# Patient Record
Sex: Male | Born: 1975 | Race: White | Hispanic: No | Marital: Single | Smoking: Never smoker
Health system: Southern US, Community
[De-identification: ages and names within clinical notes are randomized; demographics above are authoritative.]

## PROBLEM LIST (undated history)

## (undated) DIAGNOSIS — I10 Essential (primary) hypertension: Secondary | ICD-10-CM

## (undated) HISTORY — PX: HERNIA REPAIR: SHX51

---

## 2016-03-07 DIAGNOSIS — E782 Mixed hyperlipidemia: Secondary | ICD-10-CM | POA: Insufficient documentation

## 2017-07-25 DIAGNOSIS — K4090 Unilateral inguinal hernia, without obstruction or gangrene, not specified as recurrent: Secondary | ICD-10-CM | POA: Insufficient documentation

## 2017-09-19 ENCOUNTER — Other Ambulatory Visit: Payer: Self-pay | Admitting: Urology

## 2017-09-19 DIAGNOSIS — N201 Calculus of ureter: Secondary | ICD-10-CM

## 2017-09-19 DIAGNOSIS — N23 Unspecified renal colic: Secondary | ICD-10-CM

## 2017-09-26 ENCOUNTER — Ambulatory Visit
Admission: RE | Admit: 2017-09-26 | Discharge: 2017-09-26 | Disposition: A | Discharge status: Home / Self Care | Payer: BLUE CROSS/BLUE SHIELD | Source: Ambulatory Visit | Attending: Urology | Admitting: Urology

## 2017-09-26 DIAGNOSIS — N23 Unspecified renal colic: Secondary | ICD-10-CM

## 2017-09-26 DIAGNOSIS — K760 Fatty (change of) liver, not elsewhere classified: Secondary | ICD-10-CM | POA: Diagnosis not present

## 2017-09-26 DIAGNOSIS — N201 Calculus of ureter: Secondary | ICD-10-CM | POA: Diagnosis present

## 2017-09-26 MED ORDER — IOPAMIDOL (ISOVUE-300) INJECTION 61%
100.0000 mL | Freq: Once | INTRAVENOUS | Status: AC | PRN
  Administered 2017-09-26: 100 mL via INTRAVENOUS

## 2018-03-29 DIAGNOSIS — R7989 Other specified abnormal findings of blood chemistry: Secondary | ICD-10-CM | POA: Insufficient documentation

## 2018-04-20 IMAGING — CT CT ABD-PEL WO/W CM
2 of 4 series · 13 of 32 positions shown, 19 images · IV contrast (iopamidol)
Comparison: None.

CLINICAL DATA: Possible passage of a kidney stone 2 or 3 weeks ago.
History of hernia repair.

EXAM:
CT ABDOMEN AND PELVIS WITHOUT AND WITH CONTRAST
TECHNIQUE: Multidetector CT imaging of the abdomen and pelvis was performed
following the standard protocol before and following the bolus
administration of intravenous contrast.
CONTRAST:  100mL YINQ7Q-RNN IOPAMIDOL (YINQ7Q-RNN) INJECTION 61%

[Series 4: axial st · axial · 0.87mm/px · z∈[-1098,-648]mm · 11 of 110 slices shown, 17 images]
[im 10/110  soft-tissue]
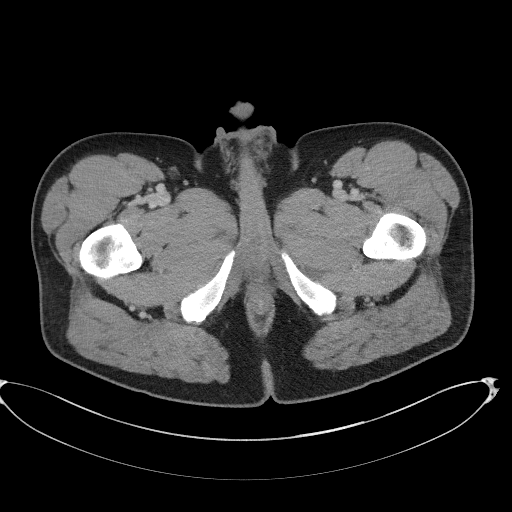
[im 10/110  bone]
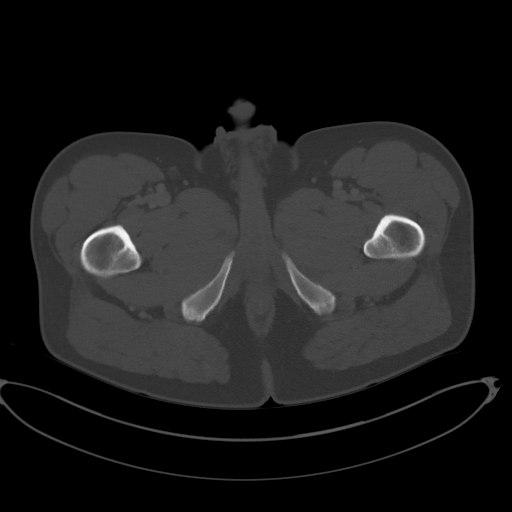
[im 19/110  soft-tissue]
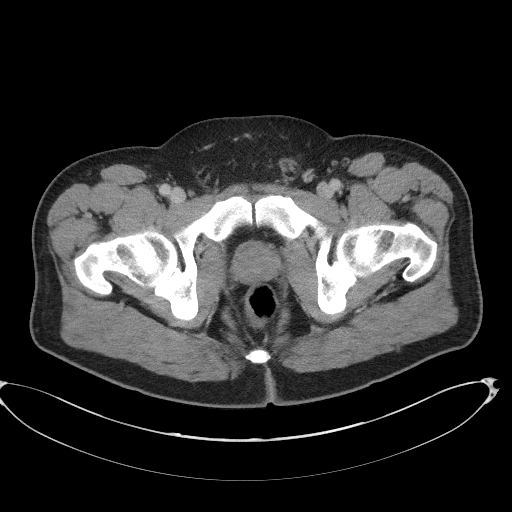
[im 28/110  soft-tissue]
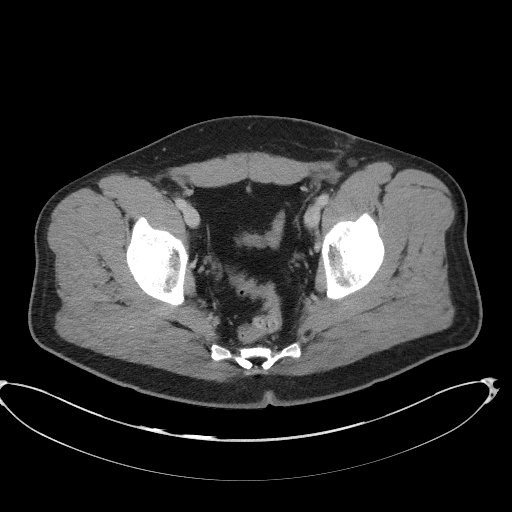
[im 37/110  soft-tissue]
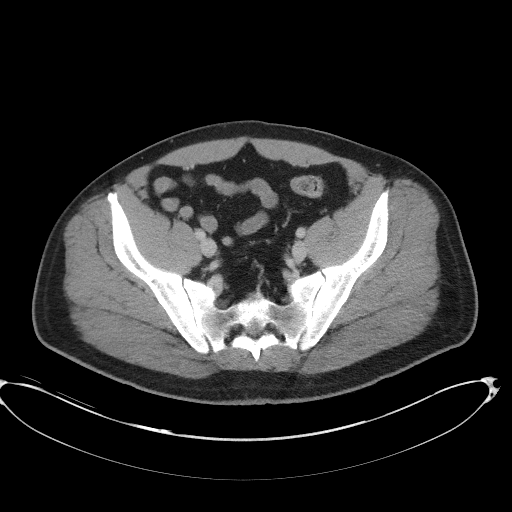
[im 46/110  soft-tissue]
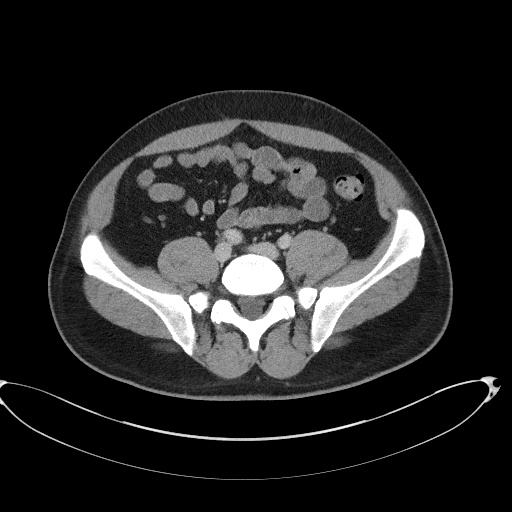
[im 55/110  soft-tissue]
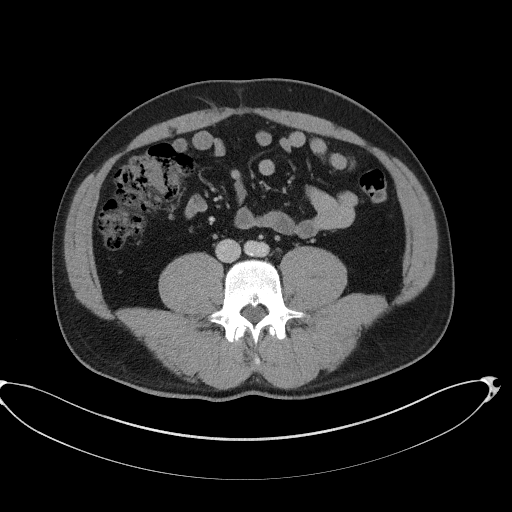
[im 64/110  soft-tissue]
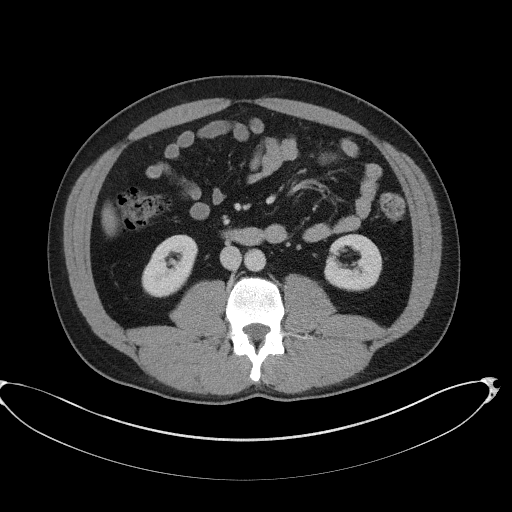
[im 73/110  soft-tissue]
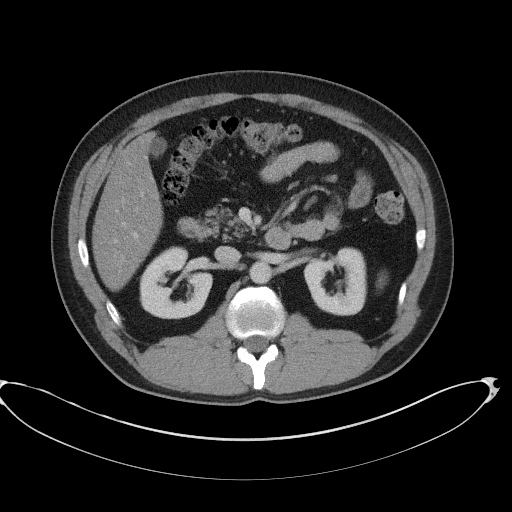
[im 73/110  lung]
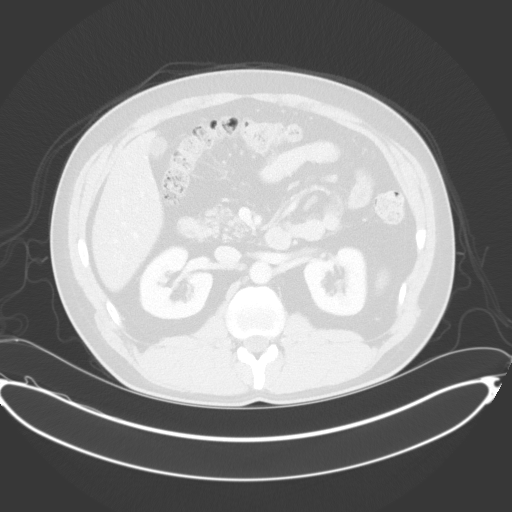
[im 82/110  soft-tissue]
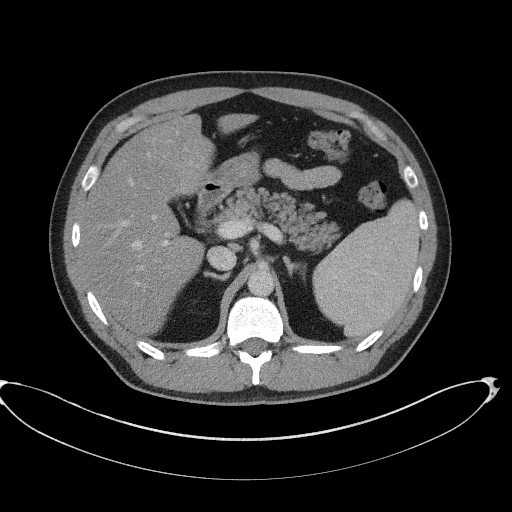
[im 82/110  lung]
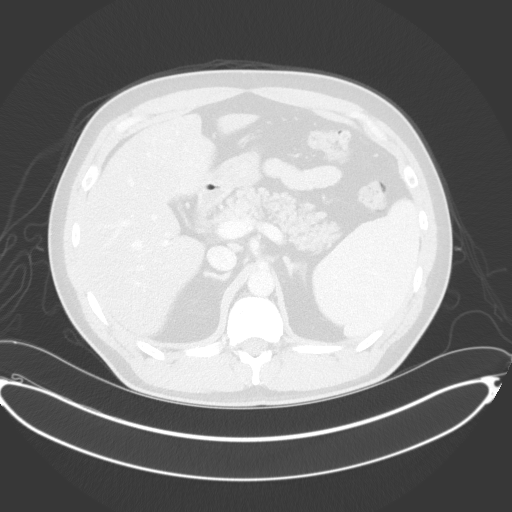
[im 82/110  bone]
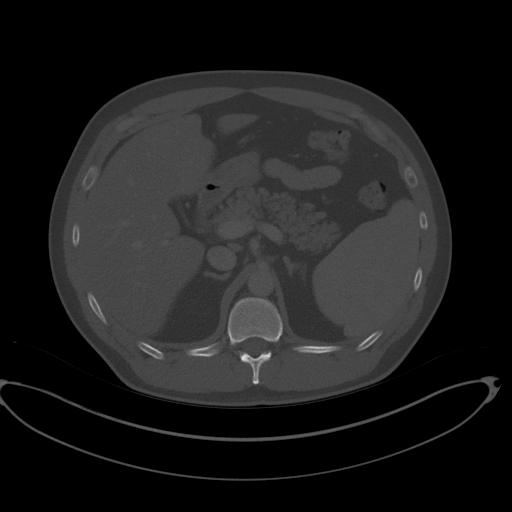
[im 91/110  soft-tissue]
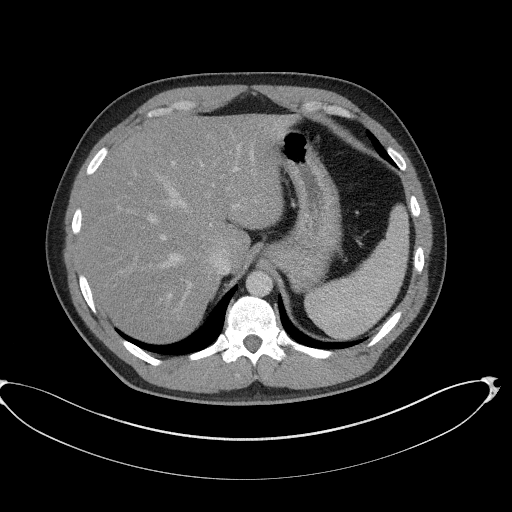
[im 91/110  lung]
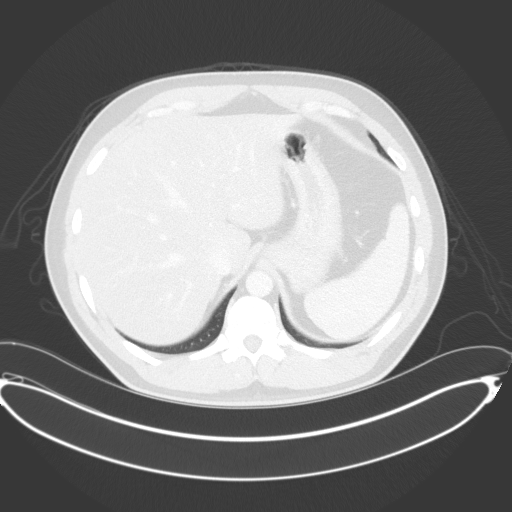
[im 100/110  soft-tissue]
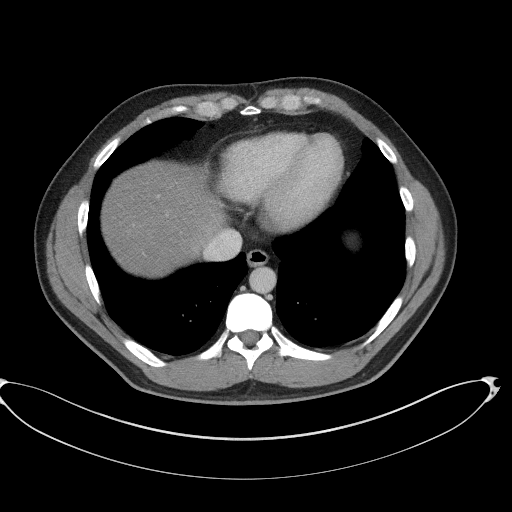
[im 100/110  lung]
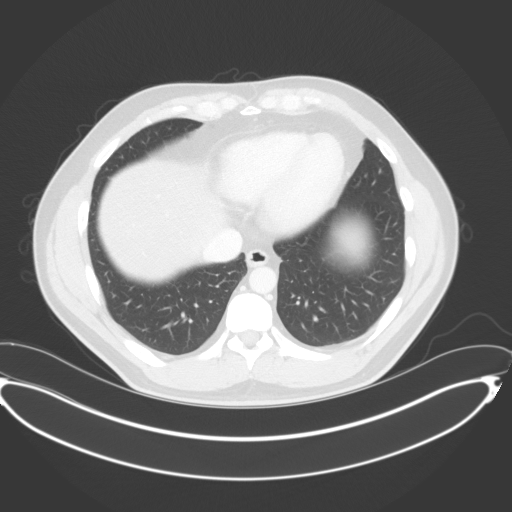

[Series 6: lung pre · axial · non-contrast · 0.82mm/px · z∈[-698,-648]mm · 2 of 31 slices shown]
[im 11/31  bone]
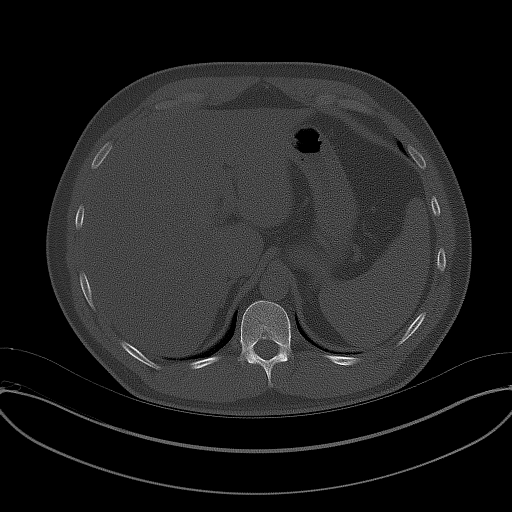
[im 21/31  bone]
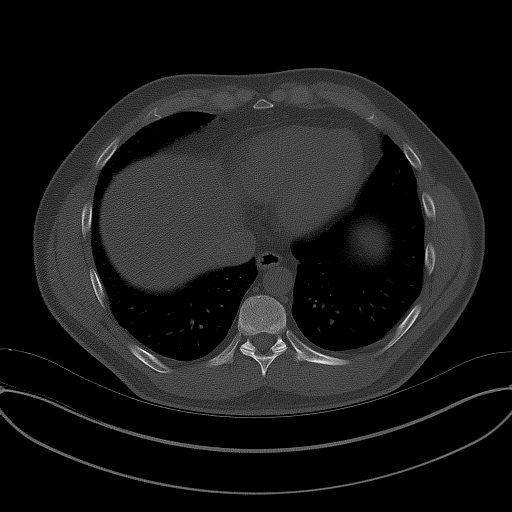

[13 of 32 positions shown; findings below may reference images not displayed]

FINDINGS: Lower chest: Clear lung bases. No significant pleural or pericardial
effusion.

Hepatobiliary: Pre contrast images demonstrate diffuse hepatic
steatosis. This appears mildly heterogeneous. Postcontrast, no
worrisome hepatic lesions or abnormal enhancement identified. No
evidence of gallstones, gallbladder wall thickening or biliary
dilatation.

Pancreas: Unremarkable. No pancreatic ductal dilatation or
surrounding inflammatory changes.

Spleen: Normal in size without focal abnormality.

Adrenals/Urinary Tract: Both adrenal glands appear normal.
Pre-contrast images demonstrate no renal, ureteral or bladder
calculi. Post-contrast, both kidneys enhance normally. There is no
evidence of enhancing renal mass. The bladder is nearly empty and
suboptimally evaluated.

Stomach/Bowel: No evidence of bowel wall thickening, distention or
surrounding inflammatory change. The appendix appears normal.

Vascular/Lymphatic: There are no enlarged abdominal or pelvic lymph
nodes. Minimal aortic atherosclerosis.

Reproductive: The prostate gland and seminal vesicles appear
unremarkable.

Other: Postsurgical changes in the left groin suggesting previous
inguinal herniorrhaphy.

Musculoskeletal: No acute or significant osseous findings.
IMPRESSION: 1. No acute findings or explanation for the patient's symptoms.
2. No evidence of urinary tract calculi.
3. Moderate mildly heterogeneous hepatic steatosis.

## 2019-02-12 DIAGNOSIS — Z87448 Personal history of other diseases of urinary system: Secondary | ICD-10-CM | POA: Insufficient documentation

## 2020-01-02 DIAGNOSIS — H2510 Age-related nuclear cataract, unspecified eye: Secondary | ICD-10-CM | POA: Insufficient documentation

## 2020-01-02 DIAGNOSIS — E559 Vitamin D deficiency, unspecified: Secondary | ICD-10-CM | POA: Insufficient documentation

## 2020-01-02 DIAGNOSIS — H40003 Preglaucoma, unspecified, bilateral: Secondary | ICD-10-CM | POA: Insufficient documentation

## 2020-01-02 DIAGNOSIS — M1A0710 Idiopathic chronic gout, right ankle and foot, without tophus (tophi): Secondary | ICD-10-CM | POA: Insufficient documentation

## 2020-01-30 DIAGNOSIS — Z961 Presence of intraocular lens: Secondary | ICD-10-CM | POA: Insufficient documentation

## 2020-12-10 DIAGNOSIS — E66811 Obesity, class 1: Secondary | ICD-10-CM | POA: Insufficient documentation

## 2022-02-25 DIAGNOSIS — G4733 Obstructive sleep apnea (adult) (pediatric): Secondary | ICD-10-CM | POA: Insufficient documentation

## 2023-01-15 ENCOUNTER — Other Ambulatory Visit: Payer: Self-pay

## 2023-01-15 ENCOUNTER — Emergency Department
Admission: EM | Admit: 2023-01-15 | Discharge: 2023-01-15 | Disposition: A | Discharge status: Home / Self Care | Payer: BC Managed Care – PPO | Attending: Emergency Medicine | Admitting: Emergency Medicine

## 2023-01-15 ENCOUNTER — Encounter: Payer: Self-pay | Admitting: Emergency Medicine

## 2023-01-15 ENCOUNTER — Ambulatory Visit
Admission: EM | Admit: 2023-01-15 | Discharge: 2023-01-15 | Disposition: A | Discharge status: Home / Self Care | Payer: BC Managed Care – PPO | Attending: Emergency Medicine | Admitting: Emergency Medicine

## 2023-01-15 DIAGNOSIS — R350 Frequency of micturition: Secondary | ICD-10-CM

## 2023-01-15 DIAGNOSIS — R3915 Urgency of urination: Secondary | ICD-10-CM | POA: Diagnosis not present

## 2023-01-15 DIAGNOSIS — R339 Retention of urine, unspecified: Secondary | ICD-10-CM | POA: Diagnosis present

## 2023-01-15 DIAGNOSIS — I10 Essential (primary) hypertension: Secondary | ICD-10-CM

## 2023-01-15 HISTORY — DX: Essential (primary) hypertension: I10

## 2023-01-15 LAB — POCT URINALYSIS DIP (MANUAL ENTRY)
Bilirubin, UA: NEGATIVE
Blood, UA: NEGATIVE
Glucose, UA: NEGATIVE mg/dL
Ketones, POC UA: NEGATIVE mg/dL
Leukocytes, UA: NEGATIVE
Nitrite, UA: NEGATIVE
Protein Ur, POC: NEGATIVE mg/dL
Spec Grav, UA: >=1.03 — AB (ref 1.010–1.025)
Urobilinogen, UA: 0.2 E.U./dL
pH, UA: 5.5 (ref 5.0–8.0)

## 2023-01-15 LAB — URINALYSIS, ROUTINE W REFLEX MICROSCOPIC
Bilirubin Urine: NEGATIVE
Glucose, UA: NEGATIVE mg/dL
Hgb urine dipstick: NEGATIVE
Ketones, ur: NEGATIVE mg/dL
Leukocytes,Ua: NEGATIVE
Nitrite: NEGATIVE
Protein, ur: NEGATIVE mg/dL
Specific Gravity, Urine: 1.02 (ref 1.005–1.030)
pH: 5 (ref 5.0–8.0)

## 2023-01-15 MED ORDER — SILODOSIN 8 MG PO CAPS: 8.0000 mg | ORAL_CAPSULE | Freq: Every day | ORAL | 0 refills | Status: AC

## 2023-01-15 NOTE — ED Notes (Signed)
Bladder scan performed. "0" mL detected. Bladder is not distended or tender with palpation.

## 2023-01-15 NOTE — ED Provider Notes (Signed)
[facility]    CSN: [id number] Arrival date & time: 01/15/23  1121      History   Chief Complaint Chief Complaint  Patient presents with   Urinary Frequency    HPI Burwell Bethel is a 47 y.o. male.  Accompanied by his mother, patient presents with 2 day history of urinary frequency and urgency.  He reports decreased urine output.  Treatment with cranberry and increased water intake; he also has taken 2 days of Levaquin that was previously prescribed for a sinus infection.  He denies pain with urination, hematuria, fever, chills, abdominal pain, flank pain, penile discharge, testicular pain, or other symptoms.  His medical history includes hypertension, renal insufficiency, obesity, hyperlipidemia, obstructive sleep apnea.      The history is provided by the patient and medical records.    Past Medical History:  Diagnosis Date   Hypertension     There are no problems to display for this patient.   Past Surgical History:  Procedure Laterality Date   HERNIA REPAIR         Home Medications    Prior to Admission medications   Medication Sig Start Date End Date Taking? Authorizing Provider  febuxostat (ULORIC) 40 MG tablet Take by mouth. 09/20/19  Yes [provider]  losartan (COZAAR) 25 MG tablet Take 1 tablet by mouth daily. 01/06/20  Yes [provider]  Calcium Carb-Cholecalciferol 600-10 MG-MCG TABS Take by mouth.    [provider]  CLOMID 50 MG tablet Take 50 mg by mouth daily.    [provider]  latanoprost (XALATAN) 0.005 % ophthalmic solution SMARTSIG:1 Drop(s) In Eye(s) Every Evening    [provider]  lovastatin (MEVACOR) 40 MG tablet Take 40 mg by mouth at bedtime.    [provider]    Family History No family history on file.  Social History Social History   Tobacco Use   Smoking status: Never   Smokeless tobacco: Never  Substance Use Topics   Alcohol use: Never   Drug use: Never      Allergies   Erythromycin, Tetracyclines & related, and Sulfa antibiotics   Review of Systems Review of Systems  Constitutional:  Negative for chills and fever.  Gastrointestinal:  Negative for abdominal pain, constipation, diarrhea, nausea and vomiting.  Genitourinary:  Positive for decreased urine volume, frequency and urgency. Negative for dysuria, hematuria, penile discharge and testicular pain.  Skin:  Negative for rash.  All other systems reviewed and are negative.    Physical Exam Triage Vital Signs ED Triage Vitals [01/15/23 1140]  Enc Vitals Group     BP      Pulse Rate 93     Resp 18     Temp 99.7 F (37.6 C)     Temp src      SpO2 98 %     Weight      Height      Head Circumference      Peak Flow      Pain Score      Pain Loc      Pain Edu?      Excl. in [place]?    No data found.  Updated Vital Signs BP (!) 138/93   Pulse 93   Temp 99.7 F (37.6 C)   Resp 18   SpO2 98%   Visual Acuity Right Eye Distance:   Left Eye Distance:   Bilateral Distance:    Right Eye Near:   Left Eye  Near:    Bilateral Near:     Physical Exam Vitals and nursing note reviewed.  Constitutional:      General: He is not in acute distress.    Appearance: Normal appearance. He is well-developed. He is not ill-appearing.  HENT:     Mouth/Throat:     Mouth: Mucous membranes are moist.  Cardiovascular:     Rate and Rhythm: Normal rate and regular rhythm.     Heart sounds: Normal heart sounds.  Pulmonary:     Effort: Pulmonary effort is normal. No respiratory distress.     Breath sounds: Normal breath sounds.  Abdominal:     General: Bowel sounds are normal.     Palpations: Abdomen is soft.     Tenderness: There is no abdominal tenderness. There is no right CVA tenderness, left CVA tenderness, guarding or rebound.  Genitourinary:    Comments: Patient declines GU exam. Musculoskeletal:     Cervical back: Neck supple.  Skin:    General: Skin is warm and dry.   Neurological:     Mental Status: He is alert.  Psychiatric:        Mood and Affect: Mood normal.        Behavior: Behavior normal.      UC Treatments / Results  Labs (all labs ordered are listed, but only abnormal results are displayed) Labs Reviewed  POCT URINALYSIS DIP (MANUAL ENTRY) - Abnormal; Notable for the following components:      Result Value   Spec Grav, UA >=1.030 (*)    All other components within normal limits    EKG   Radiology No results found.  Procedures Procedures (including critical care time)  Medications Ordered in UC Medications - No data to display  Initial Impression / Assessment and Plan / UC Course  I have reviewed the triage vital signs and the nursing notes.  Pertinent labs & imaging results that were available during my care of the patient were reviewed by me and considered in my medical decision making (see chart for details).   Urinary frequency, Elevated blood pressure with HTN.  Urine does not indicate infection.  Instructed patient to increased his water intake.  He declines GU exam or STD testing today.  Instructed him to follow up with his PCP or urologist tomorrow.  Also discussed with patient that his blood pressure is elevated today and needs to be rechecked by PCP in 2 to 4 weeks.  He states he did not take his blood pressure medication last night.  Education provided on managing hypertension.  He agrees to plan of care.   Final Clinical Impressions(s) / UC Diagnoses   Final diagnoses:  Urinary frequency  Elevated blood pressure reading in office with diagnosis of hypertension     Discharge Instructions      Your urine does not show signs of infection today.  Go to the emergency department if you are not able to pass urine or have other concerning symptoms.  Follow up with your primary care provider or urologist tomorrow.   Your blood pressure is elevated today at 146/100; repeat 138/93.  Please have this rechecked by your  primary care provider in 2-4 weeks.          ED Prescriptions   None    PDMP not reviewed this encounter.   Sharion Balloon, NP 01/15/23 1233

## 2023-01-15 NOTE — Discharge Instructions (Addendum)
Please start taking your silodosin daily for your lower urinary tract symptoms.  If you are not peeing for a period of over 12 hours please return to the emergency department.  Please follow-up with urology.

## 2023-01-15 NOTE — ED Provider Notes (Signed)
[facility] Provider Note    Event Date/Time   First MD Initiated Contact with Patient 01/15/23 1331     (approximate)   History   Urinary Retention   HPI  Jesus Rhodes is a 47 y.o. male past medical history of hypertension erectile dysfunction who presents with sensation of urinary retention.  Over the last 2 days patient has a sensation of urinary urgency.  Since Friday he feels like he is not completely emptying his bladder and is just dribbling.  Denies frank dysuria denies abdominal pain flank pain fevers chills nausea vomiting.  Denies history of similar.  Denies any pain in the rectal area with bowel movements.  No new medications.     Past Medical History:  Diagnosis Date   Hypertension     There are no problems to display for this patient.    Physical Exam  Triage Vital Signs: ED Triage Vitals  Enc Vitals Group     BP 01/15/23 1239 (!) 143/101     Pulse Rate 01/15/23 1239 80     Resp 01/15/23 1248 20     Temp 01/15/23 1239 97.7 F (36.5 C)     Temp Source 01/15/23 1239 Oral     SpO2 01/15/23 1239 100 %     Weight --      Height 01/15/23 1240 5\' 6"  (1.676 m)     Head Circumference --      Peak Flow --      Pain Score 01/15/23 1240 0     Pain Loc --      Pain Edu? --      Excl. in [place]? --     Most recent vital signs: Vitals:   01/15/23 1239 01/15/23 1248  BP: (!) 143/101   Pulse: 80   Resp:  20  Temp: 97.7 F (36.5 C)   SpO2: 100%      General: Awake, no distress.  CV:  Good peripheral perfusion.  Resp:  Normal effort.  Abd:  No distention.  Abdomen is soft nontender no palpable urinary bladder Neuro:             Awake, Alert, Oriented x 3  Other:  No CVA tenderness Normal external GU exam, no testicular tenderness or swelling  ED Results / Procedures / Treatments  Labs (all labs ordered are listed, but only abnormal results are displayed) Labs Reviewed  URINALYSIS, ROUTINE W REFLEX MICROSCOPIC - Abnormal;  Notable for the following components:      Result Value   Color, Urine YELLOW (*)    APPearance CLEAR (*)    All other components within normal limits     EKG     RADIOLOGY    PROCEDURES:  Critical Care performed: No  Procedures   MEDICATIONS ORDERED IN ED: Medications - No data to display   IMPRESSION / MDM / [place] / ED COURSE  I reviewed the triage vital signs and the nursing notes.                              Patient's presentation is most consistent with acute, uncomplicated illness.  Differential diagnosis includes, but is not limited to, urinary retention secondary to BPH, overactive bladder, prostatic malignancy, UTI  The patient is a 47 year old male who presents with sensation of urinary retention and urgency.  This has been going on for the last 2 days denies dysuria fevers chills abdominal  pain nausea vomiting.  No history of similar.  No history of prostate issues.  Denies symptoms of prostatitis.  On exam overall he looks well he has no CVA tenderness abdominal exam is benign.  Patient had bladder scan from triage that essentially showed an empty bladder.  He did note that he developed some pain over the bladder while the exam was being performed but on my evaluation he has no ongoing pain and no tenderness on exam.  UA has no evidence of pyuria or hematuria.  Given no flank pain no pain at all my suspicion for kidney stone is low.  Could be BPH.  Will start on a alpha-blocker.  Patient has a known allergy to sulfa so we will avoid tamsulosin will prescribe Silodosin.  Discussed return to ED if not able to urinate for period of over 6 to 12 hours.      FINAL CLINICAL IMPRESSION(S) / ED DIAGNOSES   Final diagnoses:  Urinary urgency     Rx / DC Orders   ED Discharge Orders          Ordered    silodosin (RAPAFLO) 8 MG CAPS capsule  Daily with breakfast        01/15/23 1406             Note:  This document was prepared using  Dragon voice recognition software and may include unintentional dictation errors.   Rada Hay, MD 01/15/23 1409

## 2023-01-15 NOTE — ED Triage Notes (Signed)
Patient to Urgent Care with complaints of urinary urgency. Reports when he does void he only urinates a small amount.  Denies any known fevers.  Symptoms started two days ago. Has been pushing fluids and drinking cranberry. Has taken two days of left over levofloxacin with no improvements.

## 2023-01-15 NOTE — Discharge Instructions (Addendum)
Your urine does not show signs of infection today.  Go to the emergency department if you are not able to pass urine or have other concerning symptoms.  Follow up with your primary care provider or urologist tomorrow.   Your blood pressure is elevated today at 146/100; repeat 138/93.  Please have this rechecked by your primary care provider in 2-4 weeks.

## 2023-01-15 NOTE — ED Triage Notes (Addendum)
Pt via POV from UC. States that he has been having urinary urgency and frequency since Friday. States that since Friday he has felt like he hasn't completely emptied his bladder. Denies hx of prostate issues. Reports pressure in his pelvic area. States they did a urinalysis at UC that was negative. Pt is A&Ox4 and NAD

## 2023-01-16 ENCOUNTER — Encounter: Payer: Self-pay | Admitting: Emergency Medicine

## 2023-01-16 ENCOUNTER — Other Ambulatory Visit: Payer: Self-pay

## 2023-01-16 ENCOUNTER — Emergency Department: Payer: BC Managed Care – PPO

## 2023-01-16 ENCOUNTER — Emergency Department
Admission: EM | Admit: 2023-01-16 | Discharge: 2023-01-16 | Disposition: A | Discharge status: Home / Self Care | Payer: BC Managed Care – PPO | Attending: Emergency Medicine | Admitting: Emergency Medicine

## 2023-01-16 DIAGNOSIS — N132 Hydronephrosis with renal and ureteral calculous obstruction: Secondary | ICD-10-CM | POA: Diagnosis not present

## 2023-01-16 DIAGNOSIS — R1030 Lower abdominal pain, unspecified: Secondary | ICD-10-CM | POA: Diagnosis present

## 2023-01-16 DIAGNOSIS — N200 Calculus of kidney: Secondary | ICD-10-CM

## 2023-01-16 LAB — URINALYSIS, ROUTINE W REFLEX MICROSCOPIC
Bacteria, UA: NONE SEEN
Bilirubin Urine: NEGATIVE
Glucose, UA: NEGATIVE mg/dL
Ketones, ur: NEGATIVE mg/dL
Leukocytes,Ua: NEGATIVE
Nitrite: NEGATIVE
Protein, ur: NEGATIVE mg/dL
Specific Gravity, Urine: 1.002 — ABNORMAL LOW (ref 1.005–1.030)
Squamous Epithelial / HPF: NONE SEEN /HPF (ref 0–5)
pH: 6 (ref 5.0–8.0)

## 2023-01-16 MED ORDER — KETOROLAC TROMETHAMINE 10 MG PO TABS: 10.0000 mg | ORAL_TABLET | Freq: Four times a day (QID) | ORAL | 0 refills | Status: DC | PRN

## 2023-01-16 MED ORDER — KETOROLAC TROMETHAMINE 30 MG/ML IJ SOLN
30.0000 mg | Freq: Once | INTRAMUSCULAR | Status: AC
  Administered 2023-01-16: 30 mg via INTRAMUSCULAR
  Filled 2023-01-16: qty 1

## 2023-01-16 MED ORDER — OXYCODONE-ACETAMINOPHEN 5-325 MG PO TABS: 1.0000 | ORAL_TABLET | Freq: Four times a day (QID) | ORAL | 0 refills | Status: DC | PRN

## 2023-01-16 NOTE — ED Triage Notes (Signed)
Presents with back pain   was seen yesterday for flank pain and urinary issues

## 2023-01-16 NOTE — ED Provider Notes (Signed)
[facility] Provider Note  Patient Contact: 6:51 PM (approximate)   History   Back Pain (/)   HPI  Jesus Rhodes is a 47 y.o. male who presents the emergency department complaining of flank pain, lower abdominal/suprapubic pain.  Patient was seen yesterday with a complaint of possible urinary retention.  Patient was not retaining urine, did not have any evidence of infection on urinalysis.  Patient had been placed on Rapaflo for symptoms.  Patient has had increased left suprapubic no left flank pain.  No dysuria though he has a feeling that he still needs to urinate frequently.  Patient has no hematuria.  No fevers or chills.     Physical Exam   Triage Vital Signs: ED Triage Vitals  Enc Vitals Group     BP 01/16/23 1727 (!) 164/109     Pulse Rate 01/16/23 1727 92     Resp 01/16/23 1727 20     Temp 01/16/23 1727 97.8 F (36.6 C)     Temp Source 01/16/23 1727 Oral     SpO2 01/16/23 1727 96 %     Weight 01/16/23 1728 235 lb (106.6 kg)     Height 01/16/23 1706 5\' 6"  (1.676 m)     Head Circumference --      Peak Flow --      Pain Score 01/16/23 1728 2     Pain Loc --      Pain Edu? --      Excl. in [place]? --     Most recent vital signs: Vitals:   01/16/23 2031 01/16/23 2141  BP: (!) 131/100 (!) 131/100  Pulse: 82 80  Resp: 19 19  Temp: 98 F (36.7 C)   SpO2: 95% 98%     General: Alert and in no acute distress.  Cardiovascular:  Good peripheral perfusion Respiratory: Normal respiratory effort without tachypnea or retractions. Lungs CTAB.  Gastrointestinal: Bowel sounds 4 quadrants.  Soft to palpation all quadrants.  Tender in the left lower quadrant/suprapubic region.  No guarding or rigidity. No palpable masses. No distention.  Left-sided CVA tenderness. Musculoskeletal: Full range of motion to all extremities.  Neurologic:  No gross focal neurologic deficits are appreciated.  Skin:   No rash noted Other:   ED Results / Procedures /  Treatments   Labs (all labs ordered are listed, but only abnormal results are displayed) Labs Reviewed  URINALYSIS, ROUTINE W REFLEX MICROSCOPIC - Abnormal; Notable for the following components:      Result Value   Color, Urine COLORLESS (*)    APPearance CLEAR (*)    Specific Gravity, Urine 1.002 (*)    Hgb urine dipstick MODERATE (*)    All other components within normal limits     EKG     RADIOLOGY  I personally viewed, evaluated, and interpreted these images as part of my medical decision making, as well as reviewing the written report by the radiologist.  ED Provider Interpretation: CT stone study returns with findings consistent with 3 mm stone in the left ureter.  There is some mild hydronephrosis.  No perinephric stranding.  CT Renal Stone Study  Result Date: 01/16/2023 CLINICAL DATA:  Abdominal/flank pain, stone suspected EXAM: CT ABDOMEN AND PELVIS WITHOUT CONTRAST TECHNIQUE: Multidetector CT imaging of the abdomen and pelvis was performed following the standard protocol without IV contrast. RADIATION DOSE REDUCTION: This exam was performed according to the departmental dose-optimization program which includes automated exposure control, adjustment of the mA and/or kV according  to patient size and/or use of iterative reconstruction technique. COMPARISON:  CT 09/26/2017 FINDINGS: Lower chest: No acute airspace disease or pleural effusion. Hepatobiliary: Diffusely decreased hepatic density typical of steatosis. Focal fatty sparing adjacent to the gallbladder fossa. No evidence of focal lesion on this unenhanced exam. Gallbladder physiologically distended, no calcified stone. No biliary dilatation. Pancreas: No ductal dilatation or inflammation. Spleen: Mildly enlarged spanning 13.2 x 8.3 x 11.9 cm (volume = 680 cm^3). No focal abnormality. Adrenals/Urinary Tract: Normal adrenal glands. 3 x 3 mm obstructing stone in the distal left ureter with mild proximal hydroureteronephrosis.  Minimal left perinephric edema. No right intrarenal calculi. Decompressed right ureter. Urinary bladder is near completely empty. Stomach/Bowel: Unremarkable appearance of the stomach. No small bowel obstruction or inflammation. Normal appendix. Moderate colonic stool burden. No colonic inflammation. Vascular/Lymphatic: No significant vascular findings are present. No enlarged abdominal or pelvic lymph nodes. Reproductive: Prostate is unremarkable. Other: No ascites or free air. Minimal fat in both inguinal canals. Tiny fat containing umbilical hernia. Musculoskeletal: There are no acute or suspicious osseous abnormalities. IMPRESSION: 1. Obstructing 3 mm stone in the distal left ureter with mild hydronephrosis. 2. Hepatic steatosis. 3. Mild splenomegaly. Electronically Signed   By: Narda Rutherford M.D.   On: 01/16/2023 20:09    PROCEDURES:  Critical Care performed: No  Procedures   MEDICATIONS ORDERED IN ED: Medications  ketorolac (TORADOL) 30 MG/ML injection 30 mg (30 mg Intramuscular Given 01/16/23 2138)     IMPRESSION / MDM / ASSESSMENT AND PLAN / ED COURSE  I reviewed the triage vital signs and the nursing notes.                                 Differential diagnosis includes, but is not limited to, nephrolithiasis, pyelonephritis, UTI, prostatitis  Patient's presentation is most consistent with acute presentation with potential threat to life or bodily function.   Patient's diagnosis is consistent with nephrolithiasis.  Patient presents emergency department for day 2 of urinary symptoms.  Patient is now experiencing left lower quadrant/left flank pain.  Urinalysis revealed some hematuria but still no evidence of infection.  Patient had CT scan which revealed 3 mm stone in the left ureter with mild hydronephrosis.  Patient is already on Rapaflo from last night, instructed to continue same.  Administered Toradol and will prescribe Toradol and pain medication for the patient at home.  If  symptoms or not improving follow-up with urology.  Concerning signs and symptoms to return to the emergency department are discussed with the patient.  Patient stable for discharge.  Follow-up primary care as needed or follow-up with urology as needed..  Patient is given ED precautions to return to the ED for any worsening or new symptoms.     FINAL CLINICAL IMPRESSION(S) / ED DIAGNOSES   Final diagnoses:  Nephrolithiasis     Rx / DC Orders   ED Discharge Orders          Ordered    ketorolac (TORADOL) 10 MG tablet  Every 6 hours PRN        01/16/23 2132    oxyCODONE-acetaminophen (PERCOCET/ROXICET) 5-325 MG tablet  Every 6 hours PRN        01/16/23 2132             Note:  This document was prepared using Dragon voice recognition software and may include unintentional dictation errors.   Racheal Patches, PA-C 01/16/23 2321  Harvest Dark, MD 01/23/23 1354

## 2023-01-25 ENCOUNTER — Ambulatory Visit (INDEPENDENT_AMBULATORY_CARE_PROVIDER_SITE_OTHER): Payer: BC Managed Care – PPO | Admitting: Urology

## 2023-01-25 ENCOUNTER — Encounter: Payer: Self-pay | Admitting: Urology

## 2023-01-25 VITALS — BP 130/89 | HR 93 | Ht 72.0 in | Wt 235.0 lb

## 2023-01-25 DIAGNOSIS — N200 Calculus of kidney: Secondary | ICD-10-CM

## 2023-01-25 DIAGNOSIS — E291 Testicular hypofunction: Secondary | ICD-10-CM

## 2023-01-25 DIAGNOSIS — Z87442 Personal history of urinary calculi: Secondary | ICD-10-CM

## 2023-01-25 DIAGNOSIS — R7989 Other specified abnormal findings of blood chemistry: Secondary | ICD-10-CM

## 2023-01-25 LAB — BLADDER SCAN AMB NON-IMAGING: Scan Result: 12

## 2023-01-25 LAB — URINALYSIS, COMPLETE
Bilirubin, UA: NEGATIVE
Glucose, UA: NEGATIVE
Ketones, UA: NEGATIVE
Leukocytes,UA: NEGATIVE
Nitrite, UA: NEGATIVE
Protein,UA: NEGATIVE
RBC, UA: NEGATIVE
Specific Gravity, UA: 1.02 (ref 1.005–1.030)
Urobilinogen, Ur: 0.2 mg/dL (ref 0.2–1.0)
pH, UA: 5.5 (ref 5.0–7.5)

## 2023-01-25 LAB — MICROSCOPIC EXAMINATION

## 2023-01-25 NOTE — Progress Notes (Signed)
01/25/2023 9:18 AM   Jesus Rhodes 03-16-1976 [id number]  Referring provider: No referring provider defined for this encounter.  Chief Complaint  Patient presents with   Benign Prostatic Hypertrophy    HPI: Jesus Rhodes is a 47 y.o. male who presents in follow-up of recent ED visit.  He is also a previous patient of Dr. Yves Dill who recently retired and he desires to establish new urologic care.  [place] urgent care visit 01/15/2023 with complaints of urinary frequency and urgency he had a negative UA and PCP or urology follow-up was recommended.  Subsequently presented to the ED for worsening symptoms.  CT was performed which showed a 3 mm left distal ureteral calculus with mild hydronephrosis He subsequently passed the stone with resolution of his symptoms.  He brings in the stone today He initially saw Dr. Yves Dill ~ 5 years ago for a similar episode Was also diagnosed with hypogonadism and has been on clomiphene.  Last saw Dr. Yves Dill December 2023 and had blood work drawn at that time   PMH: Past Medical History:  Diagnosis Date   Hypertension     Surgical History: Past Surgical History:  Procedure Laterality Date   HERNIA REPAIR      Home Medications:  Allergies as of 01/25/2023       Reactions   Erythromycin Rash   States this happened as a child   Tetracyclines & Related Rash   States this happened as a child   Sulfa Antibiotics Rash        Medication List        Accurate as of January 25, 2023  9:18 AM. If you have any questions, ask your nurse or doctor.          Calcium Carb-Cholecalciferol 600-10 MG-MCG Tabs Take by mouth.   Clomid 50 MG tablet Generic drug: clomiPHENE Take 50 mg by mouth daily.   febuxostat 40 MG tablet Commonly known as: ULORIC Take by mouth.   ketorolac 10 MG tablet Commonly known as: TORADOL Take 1 tablet (10 mg total) by mouth every 6 (six) hours as needed.   latanoprost 0.005 % ophthalmic solution Commonly  known as: XALATAN SMARTSIG:1 Drop(s) In Eye(s) Every Evening   losartan 25 MG tablet Commonly known as: COZAAR Take 1 tablet by mouth daily.   lovastatin 40 MG tablet Commonly known as: MEVACOR Take 40 mg by mouth at bedtime.   oxyCODONE-acetaminophen 5-325 MG tablet Commonly known as: PERCOCET/ROXICET Take 1 tablet by mouth every 6 (six) hours as needed for severe pain.   silodosin 8 MG Caps capsule Commonly known as: RAPAFLO Take 1 capsule (8 mg total) by mouth daily with breakfast for 10 days.        Allergies:  Allergies  Allergen Reactions   Erythromycin Rash    States this happened as a child   Tetracyclines & Related Rash    States this happened as a child   Sulfa Antibiotics Rash    Family History: No family history on file.  Social History:  reports that he has never smoked. He has never used smokeless tobacco. He reports that he does not drink alcohol and does not use drugs.   Physical Exam: BP 130/89   Pulse 93   Ht 6' (1.829 m)   Wt 235 lb (106.6 kg)   BMI 31.87 kg/m   Constitutional:  Alert and oriented, No acute distress. HEENT: [place] AT Respiratory: Normal respiratory effort, no increased work of breathing. Psychiatric: Normal mood and  affect.  Laboratory Data:  Urinalysis Dipstick/microscopy negative   Pertinent Imaging: CT images were personally reviewed and interpreted   CT Renal Stone Study  Narrative CLINICAL DATA:  Abdominal/flank pain, stone suspected  EXAM: CT ABDOMEN AND PELVIS WITHOUT CONTRAST  TECHNIQUE: Multidetector CT imaging of the abdomen and pelvis was performed following the standard protocol without IV contrast.  RADIATION DOSE REDUCTION: This exam was performed according to the departmental dose-optimization program which includes automated exposure control, adjustment of the mA and/or kV according to patient size and/or use of iterative reconstruction technique.  COMPARISON:  CT  09/26/2017  FINDINGS: Lower chest: No acute airspace disease or pleural effusion.  Hepatobiliary: Diffusely decreased hepatic density typical of steatosis. Focal fatty sparing adjacent to the gallbladder fossa. No evidence of focal lesion on this unenhanced exam. Gallbladder physiologically distended, no calcified stone. No biliary dilatation.  Pancreas: No ductal dilatation or inflammation.  Spleen: Mildly enlarged spanning 13.2 x 8.3 x 11.9 cm (volume = 680 cm^3). No focal abnormality.  Adrenals/Urinary Tract: Normal adrenal glands. 3 x 3 mm obstructing stone in the distal left ureter with mild proximal hydroureteronephrosis. Minimal left perinephric edema. No right intrarenal calculi. Decompressed right ureter. Urinary bladder is near completely empty.  Stomach/Bowel: Unremarkable appearance of the stomach. No small bowel obstruction or inflammation. Normal appendix. Moderate colonic stool burden. No colonic inflammation.  Vascular/Lymphatic: No significant vascular findings are present. No enlarged abdominal or pelvic lymph nodes.  Reproductive: Prostate is unremarkable.  Other: No ascites or free air. Minimal fat in both inguinal canals. Tiny fat containing umbilical hernia.  Musculoskeletal: There are no acute or suspicious osseous abnormalities.  IMPRESSION: 1. Obstructing 3 mm stone in the distal left ureter with mild hydronephrosis. 2. Hepatic steatosis. 3. Mild splenomegaly.   Electronically Signed By: Keith Rake M.D. On: 01/16/2023 20:09   Assessment & Plan:    1.  Recurrent nephrolithiasis Stone for analysis General stone prevention guidelines were discussed including drinking ~ 3 L of water today to maintain a urine output >2.5 L He was provided literature for stone prevention Metabolic evaluation was discussed which he has declined at this time  2.  Hypogonadism He has a form requesting prior urology records Schedule lab visit for  testosterone/hematocrit June 2024 Follow-up office visit 1 year   Abbie Sons, [facility] [address], [place], [place] [address] [phone]

## 2023-01-25 NOTE — Addendum Note (Signed)
Addended by: Chrystie Nose on: 01/25/2023 04:08 PM   Modules accepted: Orders

## 2023-02-02 LAB — CALCULI, WITH PHOTOGRAPH (CLINICAL LAB)
Calcium Oxalate Dihydrate: 30 %
Calcium Oxalate Monohydrate: 70 %
Weight Calculi: 10 mg

## 2023-02-06 ENCOUNTER — Telehealth: Payer: Self-pay | Admitting: *Deleted

## 2023-02-06 NOTE — Telephone Encounter (Signed)
-----   Message from Abbie Sons, MD sent at 02/05/2023  1:02 PM EST ----- Joaquim Lai analysis was mixed calcium oxalate which is the most common type of stone

## 2023-02-06 NOTE — Telephone Encounter (Signed)
Patient called back and was advised of results.

## 2023-07-04 ENCOUNTER — Telehealth: Payer: Self-pay | Admitting: Urology

## 2023-07-04 MED ORDER — CLOMID 50 MG PO TABS: 25.0000 mg | ORAL_TABLET | Freq: Every day | ORAL | 0 refills | Status: DC

## 2023-07-04 NOTE — Telephone Encounter (Signed)
90-day Rx sent.  Will need to review records prior to additional refills

## 2023-07-04 NOTE — Telephone Encounter (Signed)
Pt was diagnosed with hypogonadism and has been on clomiphene by Dr Evelene Croon.  He said at OV w/[facility] on 2/7, he was told when he needed a refill to call our office.  He is scheduled for labs on 8/7. He would like a 90 day supply, if possible sent to [facility] on [address]

## 2023-07-04 NOTE — Telephone Encounter (Signed)
Pt needs a new RX for Clomid 50 mg sent to [facility] [address]  Lauretta Grill pt of Dr Evelene Croon, needs new RX.   90 day supply please.

## 2023-07-04 NOTE — Telephone Encounter (Signed)
We dont have [facility] records and we did not see him for this . We was seen for kidney stone .

## 2023-07-04 NOTE — Addendum Note (Signed)
Addended by: Riki Altes on: 07/04/2023 04:52 PM   Modules accepted: Orders

## 2023-07-26 ENCOUNTER — Other Ambulatory Visit: Payer: BC Managed Care – PPO

## 2023-07-26 DIAGNOSIS — R7989 Other specified abnormal findings of blood chemistry: Secondary | ICD-10-CM

## 2023-10-09 ENCOUNTER — Ambulatory Visit (INDEPENDENT_AMBULATORY_CARE_PROVIDER_SITE_OTHER): Payer: BC Managed Care – PPO | Admitting: Dermatology

## 2023-10-09 ENCOUNTER — Encounter: Payer: Self-pay | Admitting: Dermatology

## 2023-10-09 DIAGNOSIS — L739 Follicular disorder, unspecified: Secondary | ICD-10-CM

## 2023-10-09 DIAGNOSIS — L821 Other seborrheic keratosis: Secondary | ICD-10-CM

## 2023-10-09 DIAGNOSIS — D239 Other benign neoplasm of skin, unspecified: Secondary | ICD-10-CM

## 2023-10-09 DIAGNOSIS — D2371 Other benign neoplasm of skin of right lower limb, including hip: Secondary | ICD-10-CM | POA: Diagnosis not present

## 2023-10-09 DIAGNOSIS — L720 Epidermal cyst: Secondary | ICD-10-CM | POA: Diagnosis not present

## 2023-10-09 NOTE — Patient Instructions (Addendum)
Recommend small amount of over the counter Differin 0.1% gel once daily to affected areas at right neck.   Melanoma ABCDEs  Melanoma is the most dangerous type of skin cancer, and is the leading cause of death from skin disease.  You are more likely to develop melanoma if you: Have light-colored skin, light-colored eyes, or red or blond hair Spend a lot of time in the sun Tan regularly, either outdoors or in a tanning bed Have had blistering sunburns, especially during childhood Have a close family member who has had a melanoma Have atypical moles or large birthmarks  Early detection of melanoma is key since treatment is typically straightforward and cure rates are extremely high if we catch it early.   The first sign of melanoma is often a change in a mole or a new dark spot.  The ABCDE system is a way of remembering the signs of melanoma.  A for asymmetry:  The two halves do not match. B for border:  The edges of the growth are irregular. C for color:  A mixture of colors are present instead of an even brown color. D for diameter:  Melanomas are usually (but not always) greater than 6mm - the size of a pencil eraser. E for evolution:  The spot keeps changing in size, shape, and color.  Please check your skin once per month between visits. You can use a small mirror in front and a large mirror behind you to keep an eye on the back side or your body.   If you see any new or changing lesions before your next follow-up, please call to schedule a visit.  Please continue daily skin protection including broad spectrum sunscreen SPF 30+ to sun-exposed areas, reapplying every 2 hours as needed when you're outdoors.    Due to recent changes in healthcare laws, you may see results of your pathology and/or laboratory studies on MyChart before the doctors have had a chance to review them. We understand that in some cases there may be results that are confusing or concerning to you. Please understand  that not all results are received at the same time and often the doctors may need to interpret multiple results in order to provide you with the best plan of care or course of treatment. Therefore, we ask that you please give [place] 2 business days to thoroughly review all your results before contacting the office for clarification. Should we see a critical lab result, you will be contacted sooner.   If You Need Anything After Your Visit  If you have any questions or concerns for your doctor, please call our main line at [phone] and press option 4 to reach your doctor's medical assistant. If no one answers, please leave a voicemail as directed and we will return your call as soon as possible. Messages left after 4 pm will be answered the following business day.   You may also send [place] a message via MyChart. We typically respond to MyChart messages within 1-2 business days.  For prescription refills, please ask your pharmacy to contact our office. Our fax number is [phone].  If you have an urgent issue when the clinic is closed that cannot wait until the next business day, you can page your doctor at the number below.    Please note that while we do our best to be available for urgent issues outside of office hours, we are not available 24/7.   If you have an urgent issue and  are unable to reach [place], you may choose to seek medical care at your doctor's office, retail clinic, urgent care center, or emergency room.  If you have a medical emergency, please immediately call 911 or go to the emergency department.  Pager Numbers  - Dr. Gwen Pounds: [phone]  - Dr. Roseanne Reno: [phone]  - Dr. Katrinka Blazing: [phone]   In the event of inclement weather, please call our main line at [phone] for an update on the status of any delays or closures.  Dermatology Medication Tips: Please keep the boxes that topical medications come in in order to help keep track of the instructions about where  and how to use these. Pharmacies typically print the medication instructions only on the boxes and not directly on the medication tubes.   If your medication is too expensive, please contact our office at [phone] option 4 or send [place] a message through MyChart.   We are unable to tell what your co-pay for medications will be in advance as this is different depending on your insurance coverage. However, we may be able to find a substitute medication at lower cost or fill out paperwork to get insurance to cover a needed medication.   If a prior authorization is required to get your medication covered by your insurance company, please allow [place] 1-2 business days to complete this process.  Drug prices often vary depending on where the prescription is filled and some pharmacies may offer cheaper prices.  The website www.goodrx.com contains coupons for medications through different pharmacies. The prices here do not account for what the cost may be with help from insurance (it may be cheaper with your insurance), but the website can give you the price if you did not use any insurance.  - You can print the associated coupon and take it with your prescription to the pharmacy.  - You may also stop by our office during regular business hours and pick up a GoodRx coupon card.  - If you need your prescription sent electronically to a different pharmacy, notify our office through [facility] or by phone at [phone] option 4.

## 2023-10-09 NOTE — Progress Notes (Signed)
   New Patient Visit   Subjective  Jesus Rhodes is a 47 y.o. male who presents for the following: cysts at neck. Not bothersome for patient. Also similar spots at abdomen and right knee.   No personal or fhx skin cancer that patient is aware of.   The patient has spots, moles and lesions to be evaluated, some may be new or changing and the patient may have concern these could be cancer.   The following portions of the chart were reviewed this encounter and updated as appropriate: medications, allergies, medical history  Review of Systems:  No other skin or systemic complaints except as noted in HPI or Assessment and Plan.  Objective  Well appearing patient in no apparent distress; mood and affect are within normal limits.  A focused examination was performed of the following areas: neck  Relevant exam findings are noted in the Assessment and Plan.    Assessment & Plan   DERMATOFIBROMA Exam: Firm pink/brown papulenodule with dimple sign on right medial knee Treatment Plan: A dermatofibroma is a benign growth possibly related to trauma, such as an insect bite, cut from shaving, or inflamed acne-type bump.  Treatment options to remove include shave or excision with resulting scar and risk of recurrence.  Since benign-appearing and not bothersome, will observe for now.   FOLLICULITIS Exam: Perifollicular erythematous papule at mid lower abdomen  Folliculitis occurs due to inflammation of the superficial hair follicle (pore), resulting in acne-like lesions (pus bumps). It can be infectious (bacterial, fungal) or noninfectious (shaving, tight clothing, heat/sweat, medications).  Folliculitis can be acute or chronic and recommended treatment depends on the underlying cause of folliculitis.  Treatment Plan: None today  Milia  Dermatofibroma  Folliculitis  Seborrheic keratoses  Milia - tiny firm white papules at right lateral neck, right postauricular - type of cyst -  benign - START daily OTC adapalene/Differin 0.1% gel. - may be extracted if symptomatic - observe    SEBORRHEIC KERATOSIS - Stuck-on, waxy, tan-brown papules and/or plaques  - Benign-appearing - Discussed benign etiology and prognosis. - Observe - Call for any changes    Return in about 6 months (around 04/08/2024) for recheck neck.  Anise Salvo, RMA, am acting as scribe for Elie Goody, MD .   Documentation: I have reviewed the above documentation for accuracy and completeness, and I agree with the above.  Elie Goody, MD

## 2023-10-16 ENCOUNTER — Other Ambulatory Visit: Payer: Self-pay | Admitting: Urology

## 2024-01-24 ENCOUNTER — Other Ambulatory Visit: Payer: BC Managed Care – PPO

## 2024-01-26 ENCOUNTER — Ambulatory Visit: Payer: BC Managed Care – PPO | Admitting: Urology

## 2024-02-05 ENCOUNTER — Telehealth: Payer: Self-pay | Admitting: Urology

## 2024-02-05 NOTE — Telephone Encounter (Signed)
 Talked with patient and we moved his labs up a week earlier

## 2024-02-05 NOTE — Telephone Encounter (Signed)
 Pt has appt scheduled for labs with [facility] same day as ours. Pt wants to know if he can have labs drawn there so he will only have to be stuck once.

## 2024-02-09 ENCOUNTER — Other Ambulatory Visit: Payer: Self-pay

## 2024-02-09 DIAGNOSIS — R7989 Other specified abnormal findings of blood chemistry: Secondary | ICD-10-CM

## 2024-02-12 ENCOUNTER — Other Ambulatory Visit: Payer: BC Managed Care – PPO

## 2024-02-12 DIAGNOSIS — R7989 Other specified abnormal findings of blood chemistry: Secondary | ICD-10-CM

## 2024-02-13 LAB — HEMOGLOBIN AND HEMATOCRIT, BLOOD
Hematocrit: 45.5 % (ref 37.5–51.0)
Hemoglobin: 15.4 g/dL (ref 13.0–17.7)

## 2024-02-13 LAB — TESTOSTERONE: Testosterone: 500 ng/dL (ref 264–916)

## 2024-02-13 LAB — PSA: Prostate Specific Ag, Serum: 1.6 ng/mL (ref 0.0–4.0)

## 2024-02-19 ENCOUNTER — Other Ambulatory Visit: Payer: Self-pay

## 2024-02-21 ENCOUNTER — Encounter: Payer: Self-pay | Admitting: Urology

## 2024-02-21 ENCOUNTER — Ambulatory Visit (INDEPENDENT_AMBULATORY_CARE_PROVIDER_SITE_OTHER): Payer: BC Managed Care – PPO | Admitting: Urology

## 2024-02-21 VITALS — BP 146/102 | HR 88 | Ht 72.0 in | Wt 235.0 lb

## 2024-02-21 DIAGNOSIS — Z87442 Personal history of urinary calculi: Secondary | ICD-10-CM | POA: Diagnosis not present

## 2024-02-21 DIAGNOSIS — E291 Testicular hypofunction: Secondary | ICD-10-CM | POA: Diagnosis not present

## 2024-02-21 DIAGNOSIS — R350 Frequency of micturition: Secondary | ICD-10-CM | POA: Diagnosis not present

## 2024-02-21 MED ORDER — CLOMID 50 MG PO TABS: 25.0000 mg | ORAL_TABLET | Freq: Every day | ORAL | 0 refills | Status: DC

## 2024-02-21 NOTE — Progress Notes (Signed)
 I, Jesus Rhodes, acting as a scribe for Jesus Altes, MD., have documented all relevant documentation on the behalf of Jesus Altes, MD, as directed by Jesus Altes, MD while in the presence of Jesus Altes, MD.  02/21/2024 11:35 AM   [name] 1976/11/22 [id number]  Referring provider: Marisue Ivan, MD [phone] [facility] [address] [facility] [place],  [place] [address]  Chief Complaint  Patient presents with   Nephrolithiasis   Urologic history: 1. Hypogonadism Previously followed by Dr. Evelene Croon. Symptoms of tiredness, fatigue.  On Clomid  2. Recurrent nephrotisosis Has passed 2 stones, last one in early 2024.  HPI: Jesus Rhodes is a 48 y.o. male presents for annual follow-up.   No significant problem since last year's visit. Denies recurrent renal colic.  He has noted some increased urinary frequency and does state he drinks a significant quantity of tea.  Good energy level on Clomid.  Labs 02/12/24 testosterone 500 mg/dL, PSA 1.6, H/H 56.2/13.0 LFTs performed August 2024 showed no abnormalities.   PMH: Past Medical History:  Diagnosis Date   Hypertension     Surgical History: Past Surgical History:  Procedure Laterality Date   HERNIA REPAIR      Home Medications:  Allergies as of 02/21/2024       Reactions   Erythromycin Rash   States this happened as a child   Tetracyclines & Related Rash   States this happened as a child   Sulfa Antibiotics Rash        Medication List        Accurate as of February 21, 2024 11:35 AM. If you have any questions, ask your nurse or doctor.          Clomid 50 MG tablet Generic drug: clomiPHENE Take 0.5 tablets (25 mg total) by mouth daily.   febuxostat 40 MG tablet Commonly known as: ULORIC Take by mouth.   latanoprost 0.005 % ophthalmic solution Commonly known as: XALATAN SMARTSIG:1 Drop(s) In Eye(s) Every Evening   losartan 25 MG tablet Commonly known as: COZAAR Take 1 tablet  by mouth daily.   lovastatin 40 MG tablet Commonly known as: MEVACOR Take 40 mg by mouth at bedtime.        Allergies:  Allergies  Allergen Reactions   Erythromycin Rash    States this happened as a child   Tetracyclines & Related Rash    States this happened as a child   Sulfa Antibiotics Rash    Social History:  reports that he has never smoked. He has never used smokeless tobacco. He reports that he does not drink alcohol and does not use drugs.   Physical Exam: BP (!) 146/102   Pulse 88   Ht 6' (1.829 m)   Wt 235 lb (106.6 kg)   BMI 31.87 kg/m   Constitutional:  Alert and oriented, No acute distress. HEENT: [place] AT Respiratory: Normal respiratory effort, no increased work of breathing. Psychiatric: Normal mood and affect.   Assessment & Plan:    1. Personal history of urinary calculi Presently asymptomatic, KUB at 1 year follow-up.   2. Hypogonadism On Clomid Refills sent to pharmacy [facility] 6 months- lab visit 1 year follow-up with testosterone, PSA, H/H.   3. Urinary frequency I have initially recommended fluid management and if no improvement, he will call back and will give a trial of tamsulosin.  I have reviewed the above documentation for accuracy and completeness, and I agree with the above.  Jesus Altes, MD  [facility] [address], [address] [place], [place] [address] [phone]

## 2024-02-25 ENCOUNTER — Encounter: Payer: Self-pay | Admitting: Urology

## 2024-02-28 ENCOUNTER — Other Ambulatory Visit: Payer: Self-pay | Admitting: Urology

## 2024-02-28 MED ORDER — SILDENAFIL CITRATE 50 MG PO TABS: ORAL_TABLET | ORAL | 5 refills | Status: AC

## 2024-04-08 ENCOUNTER — Encounter: Payer: Self-pay | Admitting: Dermatology

## 2024-04-08 ENCOUNTER — Ambulatory Visit: Payer: BC Managed Care – PPO | Admitting: Dermatology

## 2024-04-08 DIAGNOSIS — D492 Neoplasm of unspecified behavior of bone, soft tissue, and skin: Secondary | ICD-10-CM | POA: Diagnosis not present

## 2024-04-08 DIAGNOSIS — L821 Other seborrheic keratosis: Secondary | ICD-10-CM | POA: Diagnosis not present

## 2024-04-08 DIAGNOSIS — D2339 Other benign neoplasm of skin of other parts of face: Secondary | ICD-10-CM

## 2024-04-08 DIAGNOSIS — L720 Epidermal cyst: Secondary | ICD-10-CM | POA: Diagnosis not present

## 2024-04-08 NOTE — Progress Notes (Signed)
   Follow-Up Visit   Subjective  Jesus Rhodes is a 48 y.o. male who presents for the following: recheck neck. States he did not use the medication as directed. Used only for 1 week. Differin 0.1% gel.   Check spot on chin. Hits with razor. Lesion at right collarbone.   The patient has spots, moles and lesions to be evaluated, some may be new or changing and the patient may have concern these could be cancer.    The following portions of the chart were reviewed this encounter and updated as appropriate: medications, allergies, medical history  Review of Systems:  No other skin or systemic complaints except as noted in HPI or Assessment and Plan.  Objective  Well appearing patient in no apparent distress; mood and affect are within normal limits.  A focused examination was performed of the following areas: Neck  Relevant physical exam findings are noted in the Assessment and Plan.  Mid Chin 2 mm skin colored papule   Assessment & Plan   Milia - tiny firm white papule at right neck - type of cyst - sometimes these will clear with nightly OTC adapalene/Differin 0.1% gel or retinol. - may be extracted if symptomatic Benign-appearing. Stable compared to previous visit. Observation.    SEBORRHEIC KERATOSIS - Stuck-on, waxy, tan-brown papule at left neck - Discussed benign etiology and prognosis. - Observe - Call for any changes Benign-appearing. Stable compared to previous visit. Observation.     NEOPLASM OF SKIN Mid Chin Skin / nail biopsy Type of biopsy: tangential   Informed consent: discussed and consent obtained   Timeout: patient name, date of birth, surgical site, and procedure verified   Procedure prep:  Patient was prepped and draped in usual sterile fashion Prep type:  Isopropyl alcohol Anesthesia: the lesion was anesthetized in a standard fashion   Anesthetic:  1% lidocaine w/ epinephrine 1-100,000 buffered w/ 8.4% NaHCO3 Instrument used: DermaBlade    Hemostasis achieved with: pressure and aluminum chloride   Outcome: patient tolerated procedure well   Post-procedure details: sterile dressing applied and wound care instructions given   Dressing type: bandage and petrolatum   Specimen 1 - Surgical pathology Differential Diagnosis: Prurigo nodule vs other   Check Margins: No  Small Sample In Bottle  MILIA   SEBORRHEIC KERATOSES     Return if symptoms worsen or fail to improve.  I, Jill Parcell, CMA, am acting as scribe for Harris Liming, MD.   Documentation: I have reviewed the above documentation for accuracy and completeness, and I agree with the above.  Harris Liming, MD

## 2024-04-08 NOTE — Patient Instructions (Signed)

## 2024-04-12 LAB — SURGICAL PATHOLOGY

## 2024-04-15 ENCOUNTER — Encounter: Payer: Self-pay | Admitting: Dermatology

## 2024-04-26 ENCOUNTER — Other Ambulatory Visit: Payer: Self-pay | Admitting: Urology

## 2024-04-26 DIAGNOSIS — E291 Testicular hypofunction: Secondary | ICD-10-CM

## 2024-08-14 ENCOUNTER — Encounter: Payer: Self-pay | Admitting: Urology

## 2024-08-20 ENCOUNTER — Encounter: Payer: Self-pay | Admitting: Urology

## 2024-08-23 ENCOUNTER — Other Ambulatory Visit

## 2024-09-23 ENCOUNTER — Other Ambulatory Visit

## 2024-09-23 DIAGNOSIS — E291 Testicular hypofunction: Secondary | ICD-10-CM

## 2024-09-24 LAB — TESTOSTERONE: Testosterone: 551 ng/dL (ref 264–916)

## 2024-10-16 ENCOUNTER — Other Ambulatory Visit: Payer: Self-pay | Admitting: Urology

## 2024-10-16 DIAGNOSIS — E291 Testicular hypofunction: Secondary | ICD-10-CM

## 2024-10-23 ENCOUNTER — Encounter: Payer: Self-pay | Admitting: Urology

## 2024-12-03 ENCOUNTER — Encounter: Payer: Self-pay | Admitting: Urology

## 2024-12-05 ENCOUNTER — Other Ambulatory Visit: Payer: Self-pay | Admitting: Urology

## 2024-12-05 MED ORDER — TESTOSTERONE 20.25 MG/ACT (1.62%) TD GEL: TRANSDERMAL | 1 refills | Status: AC

## 2024-12-09 ENCOUNTER — Telehealth: Payer: Self-pay | Admitting: *Deleted

## 2024-12-09 NOTE — Telephone Encounter (Signed)
 Sent Testosterone  gel cover my meds today

## 2024-12-16 ENCOUNTER — Telehealth: Payer: Self-pay | Admitting: *Deleted

## 2024-12-16 NOTE — Telephone Encounter (Signed)
 Patient does not have two low testosterone  to get medication approved . He states he will pay out of pocket .

## 2025-01-24 ENCOUNTER — Other Ambulatory Visit

## 2025-01-24 DIAGNOSIS — E291 Testicular hypofunction: Secondary | ICD-10-CM

## 2025-02-18 ENCOUNTER — Ambulatory Visit: Admitting: Urology

## 2025-02-20 ENCOUNTER — Ambulatory Visit: Admitting: Urology
